# Patient Record
Sex: Female | Born: 1980 | Race: White | Hispanic: No | Marital: Married | Smoking: Never smoker
Health system: Southern US, Community
[De-identification: ages and names within clinical notes are randomized; demographics above are authoritative.]

## PROBLEM LIST (undated history)

## (undated) DIAGNOSIS — Z8489 Family history of other specified conditions: Secondary | ICD-10-CM

## (undated) HISTORY — PX: WISDOM TOOTH EXTRACTION: SHX21

---

## 2018-06-24 DIAGNOSIS — J111 Influenza due to unidentified influenza virus with other respiratory manifestations: Secondary | ICD-10-CM | POA: Diagnosis not present

## 2018-10-02 DIAGNOSIS — S91331A Puncture wound without foreign body, right foot, initial encounter: Secondary | ICD-10-CM | POA: Diagnosis not present

## 2018-10-21 ENCOUNTER — Other Ambulatory Visit: Payer: Self-pay

## 2018-10-21 ENCOUNTER — Encounter (HOSPITAL_COMMUNITY): Payer: Self-pay | Admitting: Emergency Medicine

## 2018-10-21 ENCOUNTER — Emergency Department (HOSPITAL_COMMUNITY): Payer: No Typology Code available for payment source

## 2018-10-21 ENCOUNTER — Emergency Department (HOSPITAL_COMMUNITY)
Admission: EM | Admit: 2018-10-21 | Discharge: 2018-10-21 | Disposition: A | Discharge status: Home / Self Care | Payer: No Typology Code available for payment source | Attending: Emergency Medicine | Admitting: Emergency Medicine

## 2018-10-21 DIAGNOSIS — S82434A Nondisplaced oblique fracture of shaft of right fibula, initial encounter for closed fracture: Secondary | ICD-10-CM | POA: Insufficient documentation

## 2018-10-21 DIAGNOSIS — Y990 Civilian activity done for income or pay: Secondary | ICD-10-CM | POA: Insufficient documentation

## 2018-10-21 DIAGNOSIS — S99911A Unspecified injury of right ankle, initial encounter: Secondary | ICD-10-CM | POA: Diagnosis present

## 2018-10-21 DIAGNOSIS — S82831A Other fracture of upper and lower end of right fibula, initial encounter for closed fracture: Secondary | ICD-10-CM | POA: Insufficient documentation

## 2018-10-21 DIAGNOSIS — Y939 Activity, unspecified: Secondary | ICD-10-CM | POA: Diagnosis not present

## 2018-10-21 DIAGNOSIS — Y929 Unspecified place or not applicable: Secondary | ICD-10-CM | POA: Diagnosis not present

## 2018-10-21 DIAGNOSIS — W010XXA Fall on same level from slipping, tripping and stumbling without subsequent striking against object, initial encounter: Secondary | ICD-10-CM | POA: Insufficient documentation

## 2018-10-21 DIAGNOSIS — W19XXXA Unspecified fall, initial encounter: Secondary | ICD-10-CM

## 2018-10-21 MED ORDER — OXYCODONE HCL 5 MG PO TABS
5.0000 mg | ORAL_TABLET | Freq: Once | ORAL | Status: AC
  Administered 2018-10-21: 5 mg via ORAL
  Filled 2018-10-21: qty 1

## 2018-10-21 MED ORDER — OXYCODONE HCL 5 MG PO TABS: 5.0000 mg | ORAL_TABLET | ORAL | 0 refills | Status: AC | PRN

## 2018-10-21 MED ORDER — ACETAMINOPHEN 500 MG PO TABS
1000.0000 mg | ORAL_TABLET | Freq: Once | ORAL | Status: AC
  Administered 2018-10-21: 1000 mg via ORAL
  Filled 2018-10-21: qty 2

## 2018-10-21 NOTE — Discharge Instructions (Signed)
You have a right fibula fracture.   Keep elevated.  Use 1000 mg acetaminophen (tylenol) every 6 hours.  Add 600 mg ibuprofen (motrin, advil) every 6 hours for more pain control.  Use oxycodone 5 mg every 4 hours or as needed for break through pain. Use laxative or stool softener if you take oxycodone.  Go to Dr Greig Right office on Wednesday, I called him and he is expecting you.

## 2018-10-21 NOTE — ED Provider Notes (Addendum)
[facility]-EMERGENCY DEPT Provider Note   CSN: [id number] Arrival date & time: 10/21/18  1410    History   Chief Complaint Chief Complaint  Patient presents with  . Fall  . Ankle Pain    HPI Betty Turner is a 38 y.o. female here for evaluation of right ankle pain. Sudden onset during a mechanical fall while at work. She slipped and fall landing on her right ankle, she felt a pop then it felt "loose".  Worse with movement, palpation. Better slightly with keeping it still, took advil after the fall and pain slightly improved but now returning. Associated with swelling to lateral ankle. Has not put weight on it. Previous soft tissue injuries to this joint but no fractures or surgeries. No distal paresthesias or loss of sensation. No other injuries after fall.         HPI  History reviewed. No pertinent past medical history.  There are no active problems to display for this patient.   History reviewed. No pertinent surgical history.   OB History   No obstetric history on file.      Home Medications    Prior to Admission medications   Medication Sig Start Date End Date Taking? Authorizing Provider  oxyCODONE (OXY IR/ROXICODONE) 5 MG immediate release tablet Take 1 tablet (5 mg total) by mouth every 4 (four) hours as needed for up to 3 days for severe pain. 10/21/18 10/24/18  Liberty HandyGibbons, Alan Drummer J, PA-C    Family History No family history on file.  Social History Social History   Tobacco Use  . Smoking status: Not on file  Substance Use Topics  . Alcohol use: Not on file  . Drug use: Not on file     Allergies   Patient has no known allergies.   Review of Systems Review of Systems  Musculoskeletal: Positive for arthralgias, gait problem and joint swelling.  All other systems reviewed and are negative.    Physical Exam Updated Vital Signs BP (!) 137/98 (BP Location: Right Arm)   Pulse 93   Temp 98.3 F (36.8 C) (Oral)   Resp 20    LMP 09/20/2018 (Approximate)   SpO2 99%   Physical Exam Constitutional:      Appearance: She is well-developed. She is not toxic-appearing.  HENT:     Head: Normocephalic.     Right Ear: External ear normal.     Left Ear: External ear normal.     Nose: Nose normal.  Eyes:     Conjunctiva/sclera: Conjunctivae normal.  Neck:     Musculoskeletal: Full passive range of motion without pain.  Cardiovascular:     Rate and Rhythm: Normal rate.     Comments: 1+ DP and PT pulses on right lower extremities. Warm toes.  Pulmonary:     Effort: Pulmonary effort is normal. No tachypnea or respiratory distress.  Musculoskeletal: Normal range of motion.        General: Swelling and tenderness present.     Comments: Moderate focal edema to right lateral ankle above malleolus, tender.  ROM of ankle deferred due to pain. Can wiggle toes without pain. Negative syndesmosis test. No joint erythema, warmth, skin injury. No calf tenderness.   Skin:    General: Skin is warm and dry.     Capillary Refill: Capillary refill takes less than 2 seconds.  Neurological:     Mental Status: She is alert and oriented to person, place, and time.     Comments: Sensation to  right top/bottom foot intact. Decreased strength with ankle F/E secondary to pain.   Psychiatric:        Behavior: Behavior normal.        Thought Content: Thought content normal.      ED Treatments / Results  Labs (all labs ordered are listed, but only abnormal results are displayed) Labs Reviewed - No data to display  EKG None  Radiology Dg Ankle Complete Right  Result Date: 10/21/2018 CLINICAL DATA:  38 year old female with fall and right ankle pain. EXAM: RIGHT ANKLE - COMPLETE 3+ VIEW COMPARISON:  None. FINDINGS: Acute oblique fracture at the distal fibula with minimal displacement. Associated soft tissue swelling. Ankle mortise appears congruent. No joint effusion on the lateral view. Degenerative changes of the hindfoot. IMPRESSION:  Acute oblique fracture of the distal fibula with minimal displacement. Associated soft tissue swelling. Electronically Signed   By: Gilmer Mor D.O.   On: 10/21/2018 15:19    Procedures Procedures (including critical care time)  Medications Ordered in ED Medications  oxyCODONE (Oxy IR/ROXICODONE) immediate release tablet 5 mg (5 mg Oral Given 10/21/18 1602)  acetaminophen (TYLENOL) tablet 1,000 mg (1,000 mg Oral Given 10/21/18 1602)     Initial Impression / Assessment and Plan / ED Course  I have reviewed the triage vital signs and the nursing notes.  Pertinent labs & imaging results that were available during my care of the patient were reviewed by me and considered in my medical decision making (see chart for details).  Clinical Course as of Oct 21 2314  Mon Oct 21, 2018  1549 FINDINGS: Acute oblique fracture at the distal fibula with minimal displacement. Associated soft tissue swelling. Ankle mortise appears congruent. No joint effusion on the lateral view. Degenerative changes of the hindfoot.  DG Ankle Complete Right [CG]  1555 Discussed x-ray with pt. Ortho tech at bedside applying splint. Left message for Dr Eulah Pont called to ensure appropriate follow up   [CG]    Clinical Course User Index [CG] Liberty Handy, PA-C      Distal fibular fracture with mild displacement. Short leg splint applied.  Pt able to ambulate with crutches. Discussed with Dr Eulah Pont who will see her in office Wednesday 830 am.  Dc with splint, RICE, 1g tylenol, 600 mg ibuprofen and oxycodone for break through/severe pain, stool softener.  Return precautions given. PMD checked. Pt comfortable with this plan.   Final Clinical Impressions(s) / ED Diagnoses   Final diagnoses:  Other closed fracture of distal end of right fibula, initial encounter  Fall, initial encounter  Nondisplaced oblique fracture of shaft of right fibula, init    ED Discharge Orders         Ordered    oxyCODONE (OXY  IR/ROXICODONE) 5 MG immediate release tablet  Every 4 hours PRN     10/21/18 1613           Liberty Handy, PA-C 10/21/18 2316    Geoffery Lyons, MD 10/24/18 [phone]

## 2018-10-21 NOTE — ED Notes (Signed)
Bed: WTR5 Expected date:  Expected time:  Means of arrival:  Comments: 

## 2018-10-21 NOTE — ED Triage Notes (Signed)
Per EMS, patient from work, reports fall in the freezer. C/o right ankle pain with swelling. No obvious deformity. Denies head injury, neck and back pain.

## 2018-10-24 ENCOUNTER — Other Ambulatory Visit: Payer: Self-pay

## 2018-10-24 ENCOUNTER — Encounter (HOSPITAL_BASED_OUTPATIENT_CLINIC_OR_DEPARTMENT_OTHER): Payer: Self-pay | Admitting: *Deleted

## 2018-10-25 ENCOUNTER — Encounter (HOSPITAL_BASED_OUTPATIENT_CLINIC_OR_DEPARTMENT_OTHER)
Admission: RE | Admit: 2018-10-25 | Discharge: 2018-10-25 | Disposition: A | Discharge status: Home / Self Care | Payer: 59 | Source: Ambulatory Visit | Attending: Orthopedic Surgery | Admitting: Orthopedic Surgery

## 2018-10-25 DIAGNOSIS — Z01812 Encounter for preprocedural laboratory examination: Secondary | ICD-10-CM | POA: Diagnosis not present

## 2018-10-25 LAB — POCT PREGNANCY, URINE: Preg Test, Ur: NEGATIVE

## 2018-10-28 ENCOUNTER — Other Ambulatory Visit: Payer: Self-pay | Admitting: Orthopaedic Surgery

## 2018-10-29 ENCOUNTER — Ambulatory Visit (HOSPITAL_BASED_OUTPATIENT_CLINIC_OR_DEPARTMENT_OTHER)
Admission: RE | Admit: 2018-10-29 | Discharge: 2018-10-29 | Disposition: A | Discharge status: Home / Self Care | Payer: No Typology Code available for payment source | Attending: Orthopaedic Surgery | Admitting: Orthopaedic Surgery

## 2018-10-29 ENCOUNTER — Other Ambulatory Visit: Payer: Self-pay

## 2018-10-29 ENCOUNTER — Ambulatory Visit (HOSPITAL_BASED_OUTPATIENT_CLINIC_OR_DEPARTMENT_OTHER): Payer: No Typology Code available for payment source | Admitting: Anesthesiology

## 2018-10-29 ENCOUNTER — Encounter (HOSPITAL_BASED_OUTPATIENT_CLINIC_OR_DEPARTMENT_OTHER): Payer: Self-pay | Admitting: *Deleted

## 2018-10-29 ENCOUNTER — Ambulatory Visit (HOSPITAL_COMMUNITY): Payer: No Typology Code available for payment source

## 2018-10-29 ENCOUNTER — Encounter (HOSPITAL_BASED_OUTPATIENT_CLINIC_OR_DEPARTMENT_OTHER)
Admission: RE | Disposition: A | Discharge status: Home / Self Care | Payer: Self-pay | Source: Home / Self Care | Attending: Orthopaedic Surgery

## 2018-10-29 DIAGNOSIS — Z419 Encounter for procedure for purposes other than remedying health state, unspecified: Secondary | ICD-10-CM

## 2018-10-29 DIAGNOSIS — W010XXA Fall on same level from slipping, tripping and stumbling without subsequent striking against object, initial encounter: Secondary | ICD-10-CM | POA: Insufficient documentation

## 2018-10-29 DIAGNOSIS — S8261XA Displaced fracture of lateral malleolus of right fibula, initial encounter for closed fracture: Secondary | ICD-10-CM | POA: Diagnosis present

## 2018-10-29 HISTORY — PX: ORIF ANKLE FRACTURE: SHX5408

## 2018-10-29 HISTORY — DX: Family history of other specified conditions: Z84.89

## 2018-10-29 SURGERY — OPEN REDUCTION INTERNAL FIXATION (ORIF) ANKLE FRACTURE
Anesthesia: General | Site: Ankle | Laterality: Right

## 2018-10-29 MED ORDER — DEXAMETHASONE SODIUM PHOSPHATE 10 MG/ML IJ SOLN
INTRAMUSCULAR | Status: DC | PRN
  Administered 2018-10-29: 10 mg via INTRAVENOUS

## 2018-10-29 MED ORDER — POVIDONE-IODINE 10 % EX SWAB: 2.0000 "application " | Freq: Once | CUTANEOUS | Status: DC

## 2018-10-29 MED ORDER — PROMETHAZINE HCL 25 MG/ML IJ SOLN
6.2500 mg | INTRAMUSCULAR | Status: DC | PRN
  Administered 2018-10-29: 6.25 mg via INTRAVENOUS

## 2018-10-29 MED ORDER — LIDOCAINE-EPINEPHRINE (PF) 1.5 %-1:200000 IJ SOLN
INTRAMUSCULAR | Status: DC | PRN
  Administered 2018-10-29: 10 mL via PERINEURAL

## 2018-10-29 MED ORDER — PROPOFOL 10 MG/ML IV BOLUS
INTRAVENOUS | Status: DC | PRN
  Administered 2018-10-29: 200 mg via INTRAVENOUS

## 2018-10-29 MED ORDER — MIDAZOLAM HCL 2 MG/2ML IJ SOLN
INTRAMUSCULAR | Status: AC
  Filled 2018-10-29: qty 2

## 2018-10-29 MED ORDER — FENTANYL CITRATE (PF) 100 MCG/2ML IJ SOLN
50.0000 ug | INTRAMUSCULAR | Status: DC | PRN
  Administered 2018-10-29: 100 ug via INTRAVENOUS
  Administered 2018-10-29: 25 ug via INTRAVENOUS

## 2018-10-29 MED ORDER — LIDOCAINE 2% (20 MG/ML) 5 ML SYRINGE
INTRAMUSCULAR | Status: AC
  Filled 2018-10-29: qty 5

## 2018-10-29 MED ORDER — LIDOCAINE 2% (20 MG/ML) 5 ML SYRINGE
INTRAMUSCULAR | Status: DC | PRN
  Administered 2018-10-29: 60 mg via INTRAVENOUS

## 2018-10-29 MED ORDER — CLONIDINE HCL (ANALGESIA) 100 MCG/ML EP SOLN
EPIDURAL | Status: DC | PRN
  Administered 2018-10-29: 70 ug

## 2018-10-29 MED ORDER — SCOPOLAMINE 1 MG/3DAYS TD PT72: 1.0000 | MEDICATED_PATCH | Freq: Once | TRANSDERMAL | Status: DC | PRN

## 2018-10-29 MED ORDER — DEXAMETHASONE SODIUM PHOSPHATE 10 MG/ML IJ SOLN
INTRAMUSCULAR | Status: AC
  Filled 2018-10-29: qty 1

## 2018-10-29 MED ORDER — ONDANSETRON HCL 4 MG/2ML IJ SOLN
INTRAMUSCULAR | Status: AC
  Filled 2018-10-29: qty 2

## 2018-10-29 MED ORDER — FENTANYL CITRATE (PF) 100 MCG/2ML IJ SOLN
INTRAMUSCULAR | Status: AC
  Filled 2018-10-29: qty 2

## 2018-10-29 MED ORDER — CEFAZOLIN SODIUM-DEXTROSE 2-4 GM/100ML-% IV SOLN
2.0000 g | INTRAVENOUS | Status: AC
  Administered 2018-10-29: 09:00:00 2 g via INTRAVENOUS

## 2018-10-29 MED ORDER — MIDAZOLAM HCL 2 MG/2ML IJ SOLN
1.0000 mg | INTRAMUSCULAR | Status: DC | PRN
  Administered 2018-10-29: 2 mg via INTRAVENOUS

## 2018-10-29 MED ORDER — KETOROLAC TROMETHAMINE 30 MG/ML IJ SOLN: 30.0000 mg | Freq: Once | INTRAMUSCULAR | Status: DC | PRN

## 2018-10-29 MED ORDER — OXYCODONE HCL 5 MG/5ML PO SOLN: 5.0000 mg | Freq: Once | ORAL | Status: DC | PRN

## 2018-10-29 MED ORDER — PROPOFOL 500 MG/50ML IV EMUL
INTRAVENOUS | Status: AC
  Filled 2018-10-29: qty 50

## 2018-10-29 MED ORDER — OXYCODONE HCL 5 MG PO TABS: 5.0000 mg | ORAL_TABLET | Freq: Once | ORAL | Status: DC | PRN

## 2018-10-29 MED ORDER — CEFAZOLIN SODIUM-DEXTROSE 2-4 GM/100ML-% IV SOLN
INTRAVENOUS | Status: AC
  Filled 2018-10-29: qty 100

## 2018-10-29 MED ORDER — CHLORHEXIDINE GLUCONATE 4 % EX LIQD: 60.0000 mL | Freq: Once | CUTANEOUS | Status: DC

## 2018-10-29 MED ORDER — PROMETHAZINE HCL 25 MG/ML IJ SOLN
INTRAMUSCULAR | Status: AC
  Filled 2018-10-29: qty 1

## 2018-10-29 MED ORDER — OXYCODONE HCL 5 MG PO TABS: 5.0000 mg | ORAL_TABLET | ORAL | 0 refills | Status: AC | PRN

## 2018-10-29 MED ORDER — LACTATED RINGERS IV SOLN
INTRAVENOUS | Status: DC
  Administered 2018-10-29 (×3): via INTRAVENOUS

## 2018-10-29 MED ORDER — FENTANYL CITRATE (PF) 100 MCG/2ML IJ SOLN: 25.0000 ug | INTRAMUSCULAR | Status: DC | PRN

## 2018-10-29 MED ORDER — EPHEDRINE SULFATE 50 MG/ML IJ SOLN
INTRAMUSCULAR | Status: DC | PRN
  Administered 2018-10-29: 10 mg via INTRAVENOUS

## 2018-10-29 MED ORDER — ROPIVACAINE HCL 5 MG/ML IJ SOLN
INTRAMUSCULAR | Status: DC | PRN
  Administered 2018-10-29: 25 mL via PERINEURAL

## 2018-10-29 MED ORDER — ONDANSETRON HCL 4 MG/2ML IJ SOLN
INTRAMUSCULAR | Status: DC | PRN
  Administered 2018-10-29: 4 mg via INTRAVENOUS

## 2018-10-29 SURGICAL SUPPLY — 76 items
BANDAGE ACE 4X5 VEL STRL LF (GAUZE/BANDAGES/DRESSINGS) IMPLANT
BANDAGE ACE 6X5 VEL STRL LF (GAUZE/BANDAGES/DRESSINGS) ×6 IMPLANT
BANDAGE ESMARK 6X9 LF (GAUZE/BANDAGES/DRESSINGS) ×1 IMPLANT
BENZOIN TINCTURE PRP APPL 2/3 (GAUZE/BANDAGES/DRESSINGS) IMPLANT
BIT DRILL 2 CANN GRADUATED (BIT) ×3 IMPLANT
BIT DRILL 2.5 CANN ENDOSCOPIC (BIT) ×3 IMPLANT
BIT DRILL 2.7 (BIT) ×2
BIT DRILL 2.7X2.7/3XSCR ANKL (BIT) ×1 IMPLANT
BIT DRL 2.7X2.7/3XSCR ANKL (BIT) ×1
BLADE SURG 15 STRL LF DISP TIS (BLADE) ×2 IMPLANT
BLADE SURG 15 STRL SS (BLADE) ×4
BNDG COHESIVE 4X5 TAN STRL (GAUZE/BANDAGES/DRESSINGS) IMPLANT
BNDG ESMARK 4X9 LF (GAUZE/BANDAGES/DRESSINGS) IMPLANT
BNDG ESMARK 6X9 LF (GAUZE/BANDAGES/DRESSINGS) ×3
CHLORAPREP W/TINT 26 (MISCELLANEOUS) ×3 IMPLANT
CLOSURE WOUND 1/2 X4 (GAUZE/BANDAGES/DRESSINGS)
COVER BACK TABLE REUSABLE LG (DRAPES) ×3 IMPLANT
COVER WAND RF STERILE (DRAPES) IMPLANT
CUFF TOURN SGL QUICK 34 (TOURNIQUET CUFF) ×2
CUFF TRNQT CYL 34X4.125X (TOURNIQUET CUFF) ×1 IMPLANT
DECANTER SPIKE VIAL GLASS SM (MISCELLANEOUS) IMPLANT
DRAPE C-ARM 42X72 X-RAY (DRAPES) ×3 IMPLANT
DRAPE C-ARMOR (DRAPES) ×3 IMPLANT
DRAPE EXTREMITY T 121X128X90 (DISPOSABLE) ×3 IMPLANT
DRAPE IMP U-DRAPE 54X76 (DRAPES) ×3 IMPLANT
DRAPE OEC MINIVIEW 54X84 (DRAPES) IMPLANT
DRAPE U-SHAPE 47X51 STRL (DRAPES) ×3 IMPLANT
ELECT REM PT RETURN 9FT ADLT (ELECTROSURGICAL) ×3
ELECTRODE REM PT RTRN 9FT ADLT (ELECTROSURGICAL) ×1 IMPLANT
GAUZE SPONGE 4X4 12PLY STRL (GAUZE/BANDAGES/DRESSINGS) ×3 IMPLANT
GAUZE XEROFORM 1X8 LF (GAUZE/BANDAGES/DRESSINGS) ×3 IMPLANT
GLOVE BIO SURGEON STRL SZ 6.5 (GLOVE) ×4 IMPLANT
GLOVE BIO SURGEON STRL SZ7.5 (GLOVE) ×3 IMPLANT
GLOVE BIO SURGEONS STRL SZ 6.5 (GLOVE) ×2
GLOVE BIOGEL PI IND STRL 8 (GLOVE) ×1 IMPLANT
GLOVE BIOGEL PI INDICATOR 8 (GLOVE) ×2
GLOVE EXAM NITRILE MD LF STRL (GLOVE) ×3 IMPLANT
GOWN STRL REUS W/ TWL LRG LVL3 (GOWN DISPOSABLE) ×2 IMPLANT
GOWN STRL REUS W/ TWL XL LVL3 (GOWN DISPOSABLE) ×1 IMPLANT
GOWN STRL REUS W/TWL LRG LVL3 (GOWN DISPOSABLE) ×4
GOWN STRL REUS W/TWL XL LVL3 (GOWN DISPOSABLE) ×2
GUIDEWIRE 1.6 (WIRE) ×2
GUIDEWIRE ORTH 157X1.6XTROC (WIRE) ×1 IMPLANT
NS IRRIG 1000ML POUR BTL (IV SOLUTION) ×3 IMPLANT
PACK BASIN DAY SURGERY FS (CUSTOM PROCEDURE TRAY) ×3 IMPLANT
PAD CAST 4YDX4 CTTN HI CHSV (CAST SUPPLIES) ×1 IMPLANT
PADDING CAST COTTON 4X4 STRL (CAST SUPPLIES) ×2
PADDING CAST SYNTHETIC 4 (CAST SUPPLIES) ×8
PADDING CAST SYNTHETIC 4X4 STR (CAST SUPPLIES) ×4 IMPLANT
PENCIL BUTTON HOLSTER BLD 10FT (ELECTRODE) ×3 IMPLANT
PLATE 5HOLE LOCKING 91MML (Plate) ×3 IMPLANT
SCREW CAN THREAD 3X18 (Screw) ×3 IMPLANT
SCREW CANCELLOUS 3X16MM (Screw) ×6 IMPLANT
SCREW NLOCK T15 FT 18X3.5XST (Screw) ×1 IMPLANT
SCREW NON LOCK 3.5X18MM (Screw) ×2 IMPLANT
SCREW NON-LOCKING 3.5X12MM (Screw) ×9 IMPLANT
SCREW NON-LOCKING 3.5X20 ANKLE (Screw) ×3 IMPLANT
SLEEVE SCD COMPRESS KNEE MED (MISCELLANEOUS) ×3 IMPLANT
SPLINT FAST PLASTER 5X30 (CAST SUPPLIES) ×40
SPLINT PLASTER CAST FAST 5X30 (CAST SUPPLIES) ×20 IMPLANT
SPONGE LAP 18X18 RF (DISPOSABLE) IMPLANT
STAPLER VISISTAT 35W (STAPLE) ×3 IMPLANT
STOCKINETTE 6  STRL (DRAPES) ×2
STOCKINETTE 6 STRL (DRAPES) ×1 IMPLANT
STRIP CLOSURE SKIN 1/2X4 (GAUZE/BANDAGES/DRESSINGS) IMPLANT
SUCTION FRAZIER HANDLE 10FR (MISCELLANEOUS) ×2
SUCTION TUBE FRAZIER 10FR DISP (MISCELLANEOUS) ×1 IMPLANT
SUT ETHILON 3 0 PS 1 (SUTURE) ×3 IMPLANT
SUT MNCRL AB 3-0 PS2 18 (SUTURE) ×3 IMPLANT
SUT PDS AB 2-0 CT2 27 (SUTURE) ×3 IMPLANT
SUT VIC AB 3-0 FS2 27 (SUTURE) IMPLANT
SYR BULB 3OZ (MISCELLANEOUS) ×3 IMPLANT
TOWEL GREEN STERILE FF (TOWEL DISPOSABLE) ×6 IMPLANT
TUBE CONNECTING 20'X1/4 (TUBING) ×1
TUBE CONNECTING 20X1/4 (TUBING) ×2 IMPLANT
UNDERPAD 30X30 (UNDERPADS AND DIAPERS) ×3 IMPLANT

## 2018-10-29 NOTE — Anesthesia Postprocedure Evaluation (Signed)
Anesthesia Post Note  Patient: Betty Turner  Procedure(s) Performed: OPEN REDUCTION INTERNAL FIXATION (ORIF)RIGHT ANKLE FRACTURE (Right Ankle)     Patient location during evaluation: PACU Anesthesia Type: General Level of consciousness: awake and alert Pain management: pain level controlled Vital Signs Assessment: post-procedure vital signs reviewed and stable Respiratory status: spontaneous breathing, nonlabored ventilation, respiratory function stable and patient connected to nasal cannula oxygen Cardiovascular status: blood pressure returned to baseline and stable Postop Assessment: no apparent nausea or vomiting Anesthetic complications: no    Last Vitals:  Vitals:   10/29/18 1115 10/29/18 1130  BP: (!) 127/93   Pulse: 90 (!) 101  Resp: 13 19  Temp:    SpO2: 100% 99%    Last Pain:  Vitals:   10/29/18 1130  TempSrc:   PainSc: (P) 0-No pain                 Devyn Griffing S

## 2018-10-29 NOTE — Anesthesia Preprocedure Evaluation (Signed)
Anesthesia Evaluation  Patient identified by MRN, date of birth, ID band Patient awake    Reviewed: Allergy & Precautions, NPO status , Patient's Chart, lab work & pertinent test results  Airway Mallampati: II  TM Distance: >3 FB Neck ROM: Full    Dental no notable dental hx.    Pulmonary neg pulmonary ROS,    Pulmonary exam normal breath sounds clear to auscultation       Cardiovascular negative cardio ROS Normal cardiovascular exam Rhythm:Regular Rate:Normal     Neuro/Psych negative neurological ROS  negative psych ROS   GI/Hepatic negative GI ROS, Neg liver ROS,   Endo/Other  negative endocrine ROS  Renal/GU negative Renal ROS  negative genitourinary   Musculoskeletal negative musculoskeletal ROS (+)   Abdominal   Peds negative pediatric ROS (+)  Hematology negative hematology ROS (+)   Anesthesia Other Findings   Reproductive/Obstetrics negative OB ROS                             Anesthesia Physical Anesthesia Plan  ASA: I  Anesthesia Plan: General   Post-op Pain Management:  Regional for Post-op pain   Induction: Intravenous  PONV Risk Score and Plan: 3 and Ondansetron, Dexamethasone, Treatment may vary due to age or medical condition and Midazolam  Airway Management Planned: LMA  Additional Equipment:   Intra-op Plan:   Post-operative Plan: Extubation in OR  Informed Consent: I have reviewed the patients History and Physical, chart, labs and discussed the procedure including the risks, benefits and alternatives for the proposed anesthesia with the patient or authorized representative who has indicated his/her understanding and acceptance.     Dental advisory given  Plan Discussed with: CRNA and Surgeon  Anesthesia Plan Comments:         Anesthesia Quick Evaluation  

## 2018-10-29 NOTE — Transfer of Care (Signed)
Immediate Anesthesia Transfer of Care Note  Patient: Betty Turner  Procedure(s) Performed: OPEN REDUCTION INTERNAL FIXATION (ORIF)RIGHT ANKLE FRACTURE (Right Ankle)  Patient Location: PACU  Anesthesia Type:GA combined with regional for post-op pain  Level of Consciousness: sedated  Airway & Oxygen Therapy: Patient Spontanous Breathing and Patient connected to nasal cannula oxygen  Post-op Assessment: Report given to RN and Post -op Vital signs reviewed and stable  Post vital signs: Reviewed and stable  Last Vitals:  Vitals Value Taken Time  BP 110/69 10/29/2018 10:47 AM  Temp    Pulse 106 10/29/2018 10:49 AM  Resp 16 10/29/2018 10:49 AM  SpO2 99 % 10/29/2018 10:49 AM  Vitals shown include unvalidated device data.  Last Pain:  Vitals:   10/29/18 0721  TempSrc: Oral  PainSc: 3       Patients Stated Pain Goal: 6 (10/29/18 0721)  Complications: No apparent anesthesia complications

## 2018-10-29 NOTE — Discharge Instructions (Signed)
DR[facility] POST-OP INSTRUCTIONS ° ° °Pain Management °1. The numbing medicine and your leg will last around 8 hours, take a dose of your pain medicine as soon as you feel it wearing off to avoid rebound pain. °2. Keep your foot elevated above heart level.  Make sure that your heel hangs free ('floats'). °3. Take all prescribed medication as directed. °4. If taking narcotic pain medication you may want to use an over-the-counter stool softener to avoid constipation. °5. You may take over-the-counter NSAIDs (ibuprofen, naproxen, etc.) as well as over-the-counter acetaminophen as directed on the packaging as a supplement for your pain and may also use it to wean away from the prescription medication. ° °Activity °?  °? Non-weightbearing °? Postoperatively, you will be placed into a splint which stays on for 2 weeks and then will be changed at your first postop visit. ° °First Postoperative Visit °1. Your first postop visit will be at least 2 weeks after surgery.  This should be scheduled when you schedule surgery. °2. If you do not have a postoperative visit scheduled please call [phone] to schedule an appointment. °3. At the appointment your incision will be evaluated for suture removal, x-rays will be obtained if necessary. ° °General Instructions °1. Swelling is very common after foot and ankle surgery.  It often takes 3 months for the foot and ankle to begin to feel comfortable.  Some amount of swelling will persist for 6-12 months. °2. DO NOT change the dressing.  If there is a problem with the dressing (too tight, loose, gets wet, etc.) please contact Dr. Adair's office. °3. DO NOT get the dressing wet.  For showers you can use an over-the-counter cast cover or wrap a washcloth around the top of your dressing and then cover it with a plastic bag and tape it to your leg. °4. DO NOT soak the incision (no tubs, pools, bath, etc.) until you have approval from Dr. Adair. ° °Contact Dr. Adairs  office or go to Emergency Room if: °1. Temperature above 101° F. °2. Increasing pain that is unresponsive to pain medication or elevation °3. Excessive redness or swelling in your foot °4. Dressing problems - excessive bloody drainage, looseness or tightness, or if dressing gets wet °5. Develop pain, swelling, warmth, or discoloration of your calf ° ° ° ° ° °Post Anesthesia Home Care Instructions ° °Activity: °Get plenty of rest for the remainder of the day. A responsible individual must stay with you for 24 hours following the procedure.  °For the next 24 hours, DO NOT: °-Drive a car °-Operate machinery °-Drink alcoholic beverages °-Take any medication unless instructed by your physician °-Make any legal decisions or sign important papers. ° °Meals: °Start with liquid foods such as gelatin or soup. Progress to regular foods as tolerated. Avoid greasy, spicy, heavy foods. If nausea and/or vomiting occur, drink only clear liquids until the nausea and/or vomiting subsides. Call your physician if vomiting continues. ° °Special Instructions/Symptoms: °Your throat may feel dry or sore from the anesthesia or the breathing tube placed in your throat during surgery. If this causes discomfort, gargle with warm salt water. The discomfort should disappear within 24 hours. ° °If you had a scopolamine patch placed behind your ear for the management of post- operative nausea and/or vomiting: ° °1. The medication in the patch is effective for 72 hours, after which it should be removed.  Wrap patch in a tissue and discard in the trash. Wash hands thoroughly   with soap and water. °2. You may remove the patch earlier than 72 hours if you experience unpleasant side effects which may include dry mouth, dizziness or visual disturbances. °3. Avoid touching the patch. Wash your hands with soap and water after contact with the patch. °   ° ° °Regional Anesthesia Blocks ° °1. Numbness or the inability to move the "blocked" extremity may last  from 3-48 hours after placement. The length of time depends on the medication injected and your individual response to the medication. If the numbness is not going away after 48 hours, call your surgeon. ° °2. The extremity that is blocked will need to be protected until the numbness is gone and the  Strength has returned. Because you cannot feel it, you will need to take extra care to avoid injury. Because it may be weak, you may have difficulty moving it or using it. You may not know what position it is in without looking at it while the block is in effect. ° °3. For blocks in the legs and feet, returning to weight bearing and walking needs to be done carefully. You will need to wait until the numbness is entirely gone and the strength has returned. You should be able to move your leg and foot normally before you try and bear weight or walk. You will need someone to be with you when you first try to ensure you do not fall and possibly risk injury. ° °4. Bruising and tenderness at the needle site are common side effects and will resolve in a few days. ° °5. Persistent numbness or new problems with movement should be communicated to the surgeon or the [facility] ([phone])/ [facility] ([phone]). ° °

## 2018-10-29 NOTE — Anesthesia Procedure Notes (Signed)
Anesthesia Procedure Image    

## 2018-10-29 NOTE — H&P (Signed)
Betty Turner is an 38 y.o. female.   Chief Complaint: Right displaced lateral malleolus fracture, comminuted. HPI: Betty Turner is here today for surgical intervention of her right lateral malleolus.  As you recall on 10/21/2018 she sustained an injury while slipping at work in the freezer.  She had immediate pain and deformity in her ankle.  She was seen in the office and given the amount of displacement and her activity level she is indicated for operative treatment.  Today she is amenable to surgery.  She denies any recent fevers or chills.  She has been maintaining nonweightbearing and tolerating the splint well.  No new complaints.  Past Medical History:  Diagnosis Date  . Family history of adverse reaction to anesthesia    ponv - mom    Past Surgical History:  Procedure Laterality Date  . WISDOM TOOTH EXTRACTION      History reviewed. No pertinent family history. Social History:  reports that she has never smoked. She has never used smokeless tobacco. She reports current alcohol use. She reports that she does not use drugs.  Allergies: No Known Allergies  Medications Prior to Admission  Medication Sig Dispense Refill  . acetaminophen (TYLENOL) 500 MG tablet Take 1,000 mg by mouth every 6 (six) hours as needed for mild pain.    Marland Kitchen oxyCODONE (OXY IR/ROXICODONE) 5 MG immediate release tablet Take 5 mg by mouth every 4 (four) hours as needed for severe pain.      No results found for this or any previous visit (from the past 48 hour(s)). No results found.  Review of Systems  Constitutional: Negative.   HENT: Negative.   Eyes: Negative.   Respiratory: Negative.   Cardiovascular: Negative.   Gastrointestinal: Negative.   Musculoskeletal:       Right ankle pain  Skin: Negative.   Neurological: Negative.   Psychiatric/Behavioral: Negative.     Blood pressure 120/75, pulse 99, temperature 98.4 F (36.9 C), temperature source Oral, resp. rate 15, height 6' (1.829 m), weight 97 kg,  last menstrual period 09/24/2018. Physical Exam  Constitutional: She appears well-developed.  HENT:  Head: Normocephalic.  Eyes: Conjunctivae are normal.  Neck: Neck supple.  Cardiovascular: Normal rate.  Respiratory: Effort normal.  GI: Soft.  Musculoskeletal:     Comments: Right ankle is in a short leg splint.  Toes exposed are warm and well-perfused.  She is able to wiggle toes.  She endorses sensation of the toes.  No tenderness proximal to the splint.  No evidence of knee or thigh injury.  No evidence of bilateral upper extremity or left lower extremity injury.  Neurological: She is alert.  Skin: Skin is warm.  Psychiatric: She has a normal mood and affect.     Assessment/Plan We will plan with open treatment of her right lateral malleolus fracture and stressing of her syndesmosis.  If syndesmosis is unstable will perform open treatment of this.  She understands the risk, benefits alternatives surgery which include wound healing complications, infection, nonunion, malunion, need for further surgery, damage surrounding structures.  She also understands the perioperative and anesthetic risk which include death.  We discussed the postoperative weightbearing restrictions which will be dependent on the need for syndesmotic fixation.  We will proceed with surgery.  [name], MD 10/29/2018, 8:20 AM

## 2018-10-29 NOTE — Op Note (Signed)
Betty Turner female 38 y.o. 10/29/2018  PreOperative Diagnosis: Right displaced, comminuted lateral malleolus fracture  PostOperative Diagnosis: Same  PROCEDURE: Open reduction internal fixation of right lateral malleolus fracture  SURGEON: Dub Mikes, MD  ASSISTANT: None  ANESTHESIA: General LMA anesthesia with popliteal nerve block  FINDINGS: Comminuted right lateral malleolus fracture  IMPLANTS: Arthrex distal fibular locking plate  [id number]:38 y.o. female with no significant past medical history slipped in a large freezer while at work approximately 1 week ago.  She had immediate pain and deformity in her ankle.  She was seen at an outside facility where x-rays revealed a distal fibular fracture that was comminuted and displaced.  She was sent to my office for evaluation.  Given the amount of displacement, comminution and her activity level she was indicated for open treatment.  Plan was for open treatment of her lateral malleolus and stressing of the syndesmosis.  We discussed the risk, benefits alternatives surgery which included but were not limited to wound healing complications, infection, nonunion, malunion, development of pain, continued pain, demonstrating structures or need for further surgery.  She is we also discussed the perioperative and anesthetic risk which she understood.  These included death.  We also discussed the perioperative weightbearing restrictions.  She agreed to comply and wish to proceed with surgery.  PROCEDURE: Patient was identified in the preoperative holding area.  The right lower extremity was marked by myself.  Consent was signed myself and the patient.  She is taken the operative suite placed supine the operative table after a peripheral nerve block was performed by anesthesia.  General anesthesia was induced that difficulty.  Preoperative antibiotics were given.  A bump was placed under the right hip and a thigh tourniquet was placed in  the right thigh.  All bony prominences were well-padded.  Surgical timeout was performed after prepped and draped in usual sterile fashion of the right lower extremity.  A bone foam was used.  The right leg was elevated and the tourniquet was elevated to 250 mmHg.  We began by making a longitudinal incision in line with the distal fibula overlying the fracture site.  This taken sharply down through skin subcutaneous tissue.  Then blunt dissection was used in an attempt to identify any branch of the superficial peroneal nerve which were not in the surgical field.  Then the fracture site was identified and sharply using a 15 blade the periosteal tissue was incised longitudinally.  This was cleared out from the fracture site.  It was noted that there was comminution at the fracture site.  There was a large distal fragment as well as a large anterior fragment attached to the syndesmotic ligaments that was displaced.  Using a curette and rondure the fracture sites were debrided of hematoma and free fragments.  Then the fracture was displaced and the ankle joint was debrided of hematoma and irrigated with saline.  There is no evidence of lateral talar dome lesion.  Then the large distal fragment was reduced under direct visualization and held in place with a lobster claw.  Then the anterior piece was reduced again under direct visualization and held with a pointed reduction clamp and provisionally with a K wire.  Then fluoroscopy was used to assess reduction which was found to be acceptable.  Then a distal fibular locking plate was placed about the fracture site with a combination of 3.5 mm cortical and 3.73mm cancellus screws.  Then the anterior fragment was compressed further with a pointed reduction clamp  and a 3.5 millimeter screw was placed in the anterior to posterior direction capturing this fragment which was attached to the syndesmotic ligaments.  Then this fragment was captured further with a 3.5 millimeters  screw through the plate.  Again fluoroscopy was used to confirm maintenance of reduction and fixation.  Then the ankle was stressed under fluoroscopy and found to be stable with no medial clear space widening or notable syndesmotic widening.  The wound was irrigated with normal saline and the deep tissue was closed with a 2-0 PDS suture over the plate.  The subcuticular tissue was closed with a 3-0 nylon.  The tourniquet was released.  Hemostasis was obtained.  The skin was closed with staples.  Xeroform, 4 x 4's and sterile she cotton was placed.  She was placed in a short leg nonweightbearing splint.  Counts were correct at the end the case.  There were no complications.  She was awakened from anesthesia and taken to recovery room in stable condition.  POST OPERATIVE INSTRUCTIONS: Nonweightbearing to right lower extremity. Keep splint dry Elevate limb Follow-up in 2 weeks for splint removal, x-rays, suture removal and boot placement. Call the office with concerns. No need for DVT prophylaxis in this young healthy patient   TOURNIQUET TIME: 53 minutes  BLOOD LOSS:  Minimal         DRAINS: none         SPECIMEN: none       COMPLICATIONS:  * No complications entered in OR log *         Disposition: PACU - hemodynamically stable.         Condition: stable

## 2018-10-29 NOTE — Brief Op Note (Signed)
10/29/2018  10:55 AM  PATIENT:  Betty Turner  38 y.o. female  PRE-OPERATIVE DIAGNOSIS:  RIGHT ANKLE Fracture  POST-OPERATIVE DIAGNOSIS:  RIGHT ANKLE Fracture  PROCEDURE:  Procedure(s): OPEN REDUCTION INTERNAL FIXATION (ORIF)RIGHT ANKLE FRACTURE (Right) Lateral malleolus  SURGEON:  Surgeon(s) and Role:    [name], MD - Primary  PHYSICIAN ASSISTANT:   ASSISTANTS: none   ANESTHESIA:   general  EBL: Minimal  BLOOD ADMINISTERED:none  DRAINS: none   LOCAL MEDICATIONS USED:  NONE  SPECIMEN:  No Specimen  DISPOSITION OF SPECIMEN:  N/A  COUNTS:  YES  TOURNIQUET:   Total Tourniquet Time Documented: Thigh (Right) - 53 minutes Total: Thigh (Right) - 53 minutes   DICTATION: .Reubin Milan Dictation  PLAN OF CARE: Discharge to home after PACU  PATIENT DISPOSITION:  PACU - hemodynamically stable.   Delay start of Pharmacological VTE agent (>24hrs) due to surgical blood loss or risk of bleeding: not applicable

## 2018-10-29 NOTE — Anesthesia Procedure Notes (Signed)
Anesthesia Regional Block: Popliteal block   Pre-Anesthetic Checklist: ,, timeout performed, Correct Patient, Correct Site, Correct Laterality, Correct Procedure, Correct Position, site marked, Risks and benefits discussed,  Surgical consent,  Pre-op evaluation,  At surgeon's request and post-op pain management  Laterality: Right  Prep: chloraprep       Needles:  Injection technique: Single-shot  Needle Type: Echogenic Needle     Needle Length: 9cm      Additional Needles:   Procedures:,,,, ultrasound used (permanent image in chart),,,,  Narrative:  Start time: 10/29/2018 8:34 AM End time: 10/29/2018 8:41 AM Injection made incrementally with aspirations every 5 mL.  Performed by: Personally  Anesthesiologist: Eilene Ghazi, MD  Additional Notes: Patient tolerated the procedure well without complications

## 2018-10-29 NOTE — Progress Notes (Addendum)
Assisted Dr. Okey Dupre with right, ultrasound guided, popliteal block. Side rails up, monitors on throughout procedure. See vital signs in flow sheet. Tolerated Procedure well.Time out at 0830 block started at 0835  Pop block right leg by Dr Eilene Ghazi

## 2018-10-29 NOTE — Anesthesia Procedure Notes (Signed)
Procedure Name: LMA Insertion Date/Time: 10/29/2018 9:29 AM Performed by: Burna Cash, CRNA Pre-anesthesia Checklist: Patient identified, Emergency Drugs available, Suction available and Patient being monitored Patient Re-evaluated:Patient Re-evaluated prior to induction Oxygen Delivery Method: Circle system utilized Preoxygenation: Pre-oxygenation with 100% oxygen Induction Type: IV induction Ventilation: Mask ventilation without difficulty LMA: LMA inserted LMA Size: 4.0 Number of attempts: 1 Airway Equipment and Method: Bite block Placement Confirmation: positive ETCO2 Tube secured with: Tape Dental Injury: Teeth and Oropharynx as per pre-operative assessment

## 2018-10-30 ENCOUNTER — Encounter (HOSPITAL_BASED_OUTPATIENT_CLINIC_OR_DEPARTMENT_OTHER): Payer: Self-pay | Admitting: Orthopaedic Surgery

## 2020-08-30 DIAGNOSIS — Z20828 Contact with and (suspected) exposure to other viral communicable diseases: Secondary | ICD-10-CM | POA: Diagnosis not present

## 2020-08-30 DIAGNOSIS — B9689 Other specified bacterial agents as the cause of diseases classified elsewhere: Secondary | ICD-10-CM | POA: Diagnosis not present

## 2020-08-30 DIAGNOSIS — J22 Unspecified acute lower respiratory infection: Secondary | ICD-10-CM | POA: Diagnosis not present

## 2020-11-27 IMAGING — CR RIGHT ANKLE - COMPLETE 3+ VIEW
3 series · 3 of 3 positions shown · non-contrast
Comparison: None.

CLINICAL DATA: 37-year-old female with fall and right ankle pain.

EXAM:
RIGHT ANKLE - COMPLETE 3+ VIEW

[x ankle ap right]
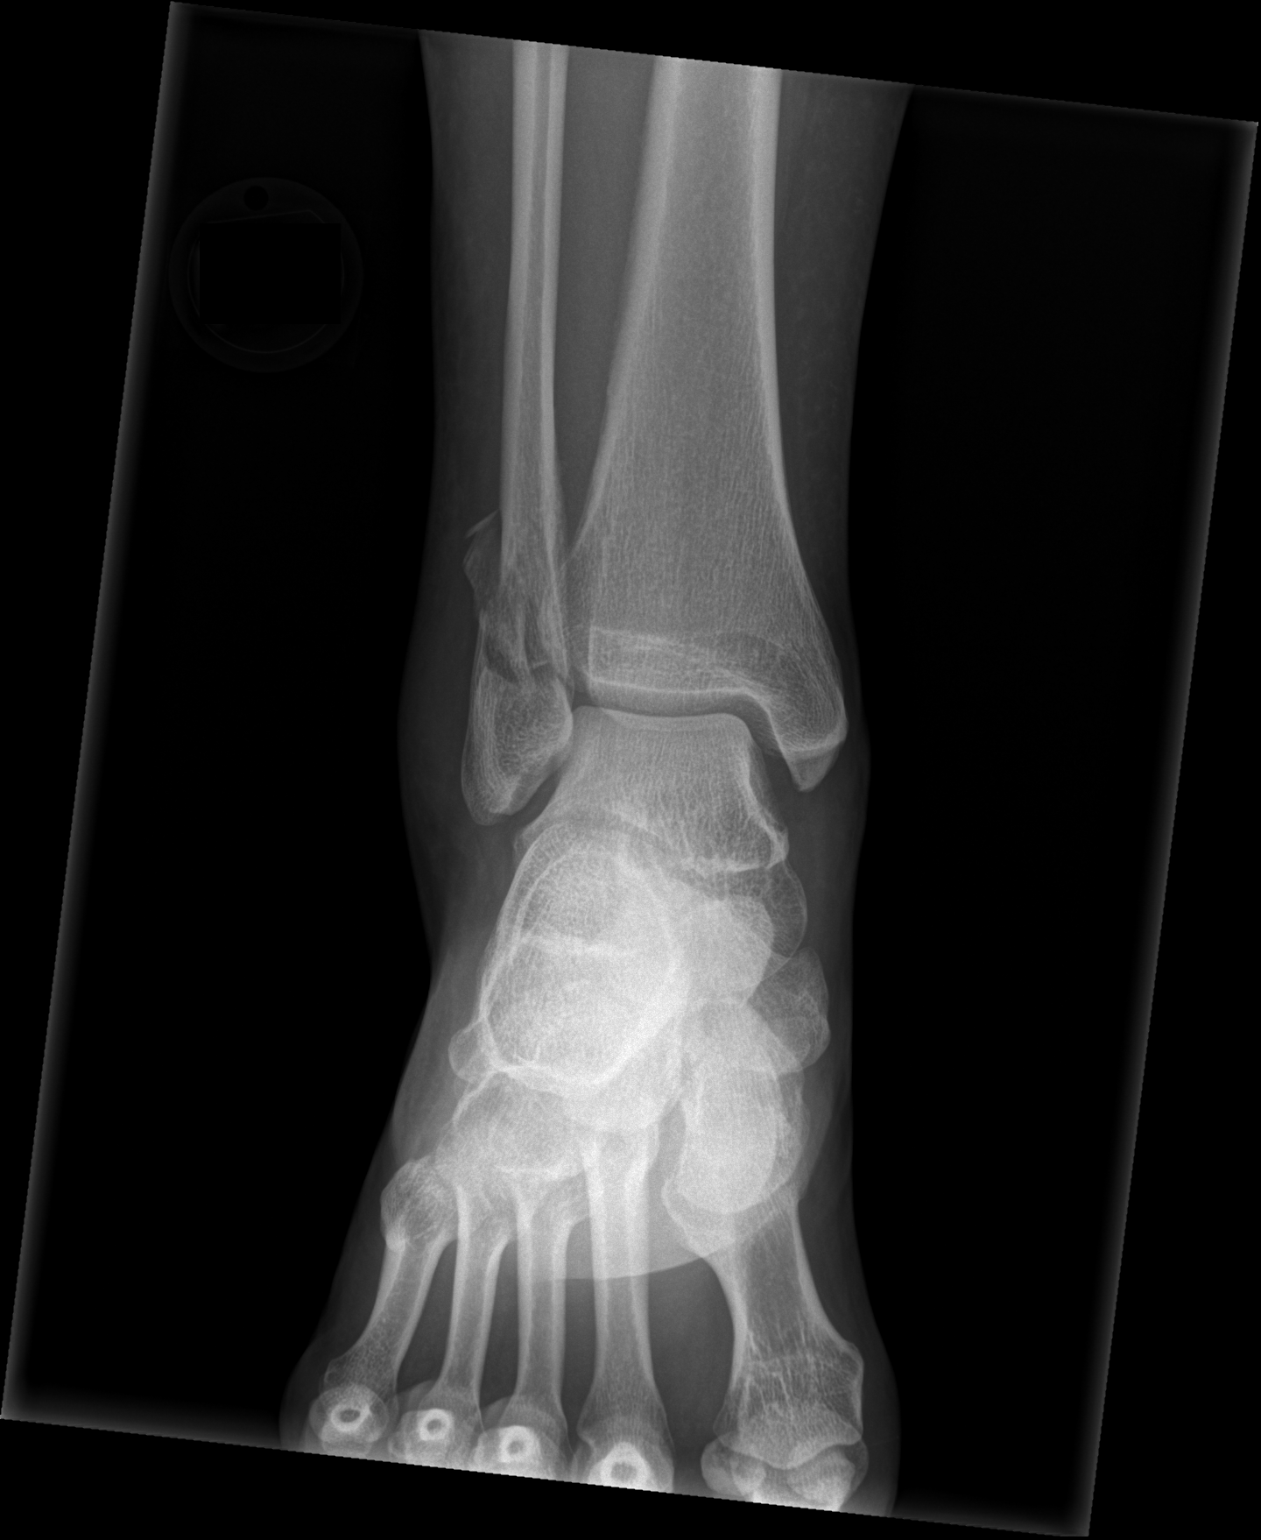

[x ankle obl right]
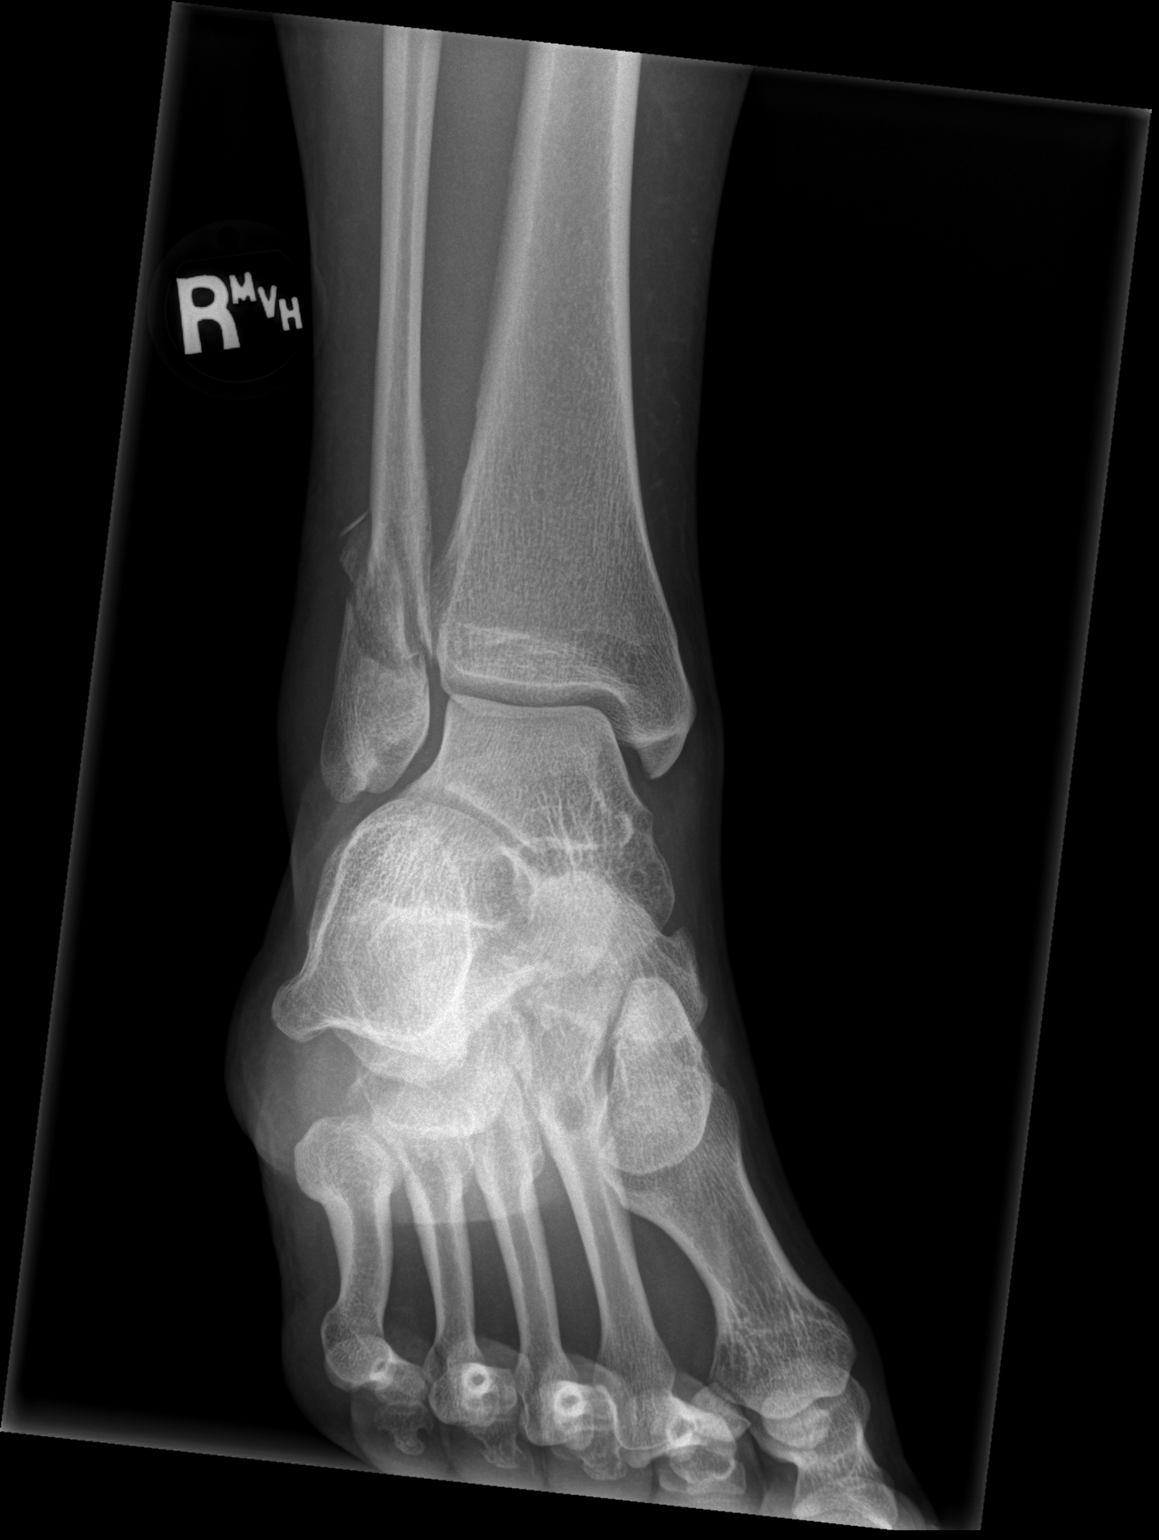

[x ankle lat right]
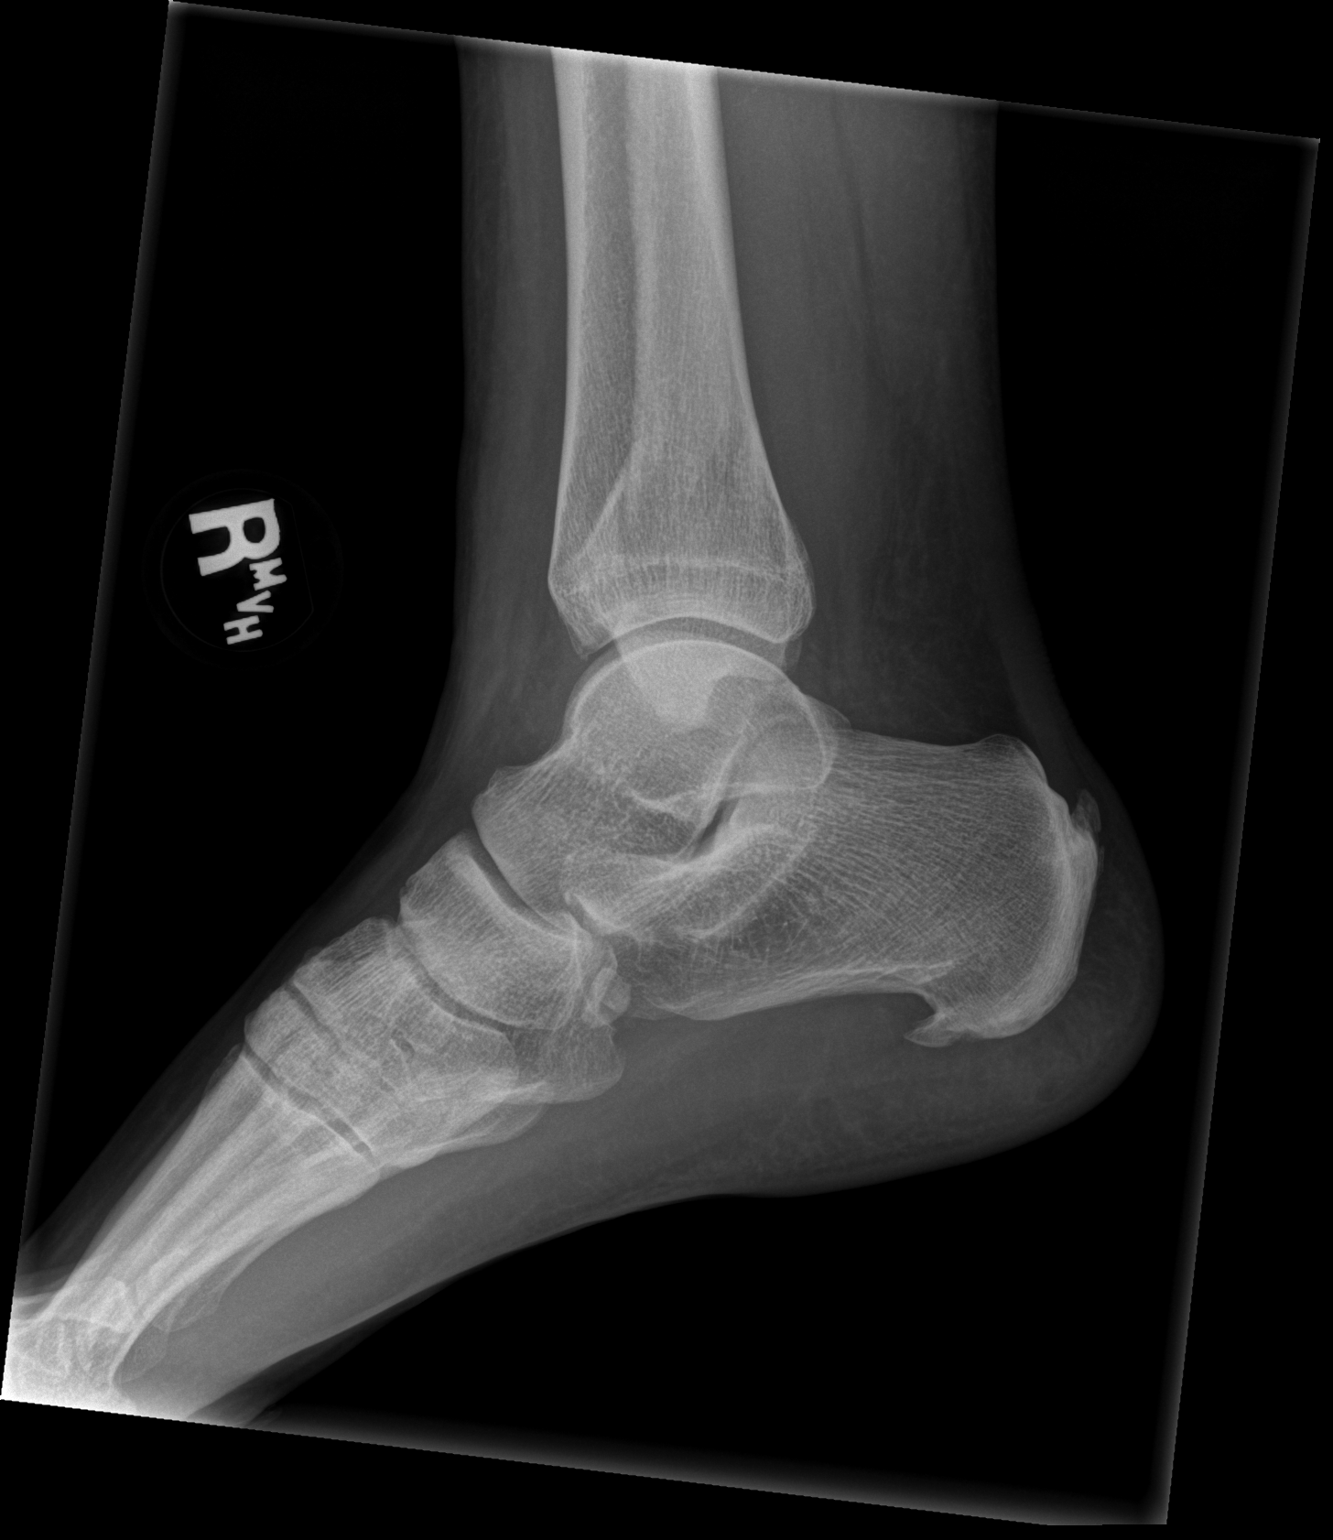

[3 of 3 positions shown; findings below may reference images not displayed]

FINDINGS: Acute oblique fracture at the distal fibula with minimal
displacement. Associated soft tissue swelling. Ankle mortise appears
congruent. No joint effusion on the lateral view. Degenerative
changes of the hindfoot.
IMPRESSION: Acute oblique fracture of the distal fibula with minimal
displacement. Associated soft tissue swelling.

## 2021-04-20 DIAGNOSIS — F4323 Adjustment disorder with mixed anxiety and depressed mood: Secondary | ICD-10-CM | POA: Diagnosis not present

## 2022-02-28 DIAGNOSIS — K529 Noninfective gastroenteritis and colitis, unspecified: Secondary | ICD-10-CM | POA: Diagnosis not present
